# Patient Record
Sex: Male | Born: 1985 | Race: Black or African American | Hispanic: No | Marital: Single | Smoking: Never smoker
Health system: Southern US, Community
[De-identification: ages and names within clinical notes are randomized; demographics above are authoritative.]

## PROBLEM LIST (undated history)

## (undated) HISTORY — PX: HAND SURGERY: SHX662

---
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
## 2015-04-15 ENCOUNTER — Ambulatory Visit
Admission: EM | Admit: 2015-04-15 | Discharge: 2015-04-15 | Disposition: A | Discharge status: Other Acute Inpatient Hospital | Payer: Self-pay

## 2015-04-16 ENCOUNTER — Ambulatory Visit
Admission: EM | Admit: 2015-04-16 | Discharge: 2015-04-16 | Disposition: A | Discharge status: Home / Self Care | Payer: Self-pay | Attending: Family Medicine | Admitting: Family Medicine

## 2015-04-16 ENCOUNTER — Ambulatory Visit: Payer: Self-pay

## 2015-04-16 ENCOUNTER — Encounter: Payer: Self-pay | Admitting: Emergency Medicine

## 2015-04-16 DIAGNOSIS — M5489 Other dorsalgia: Secondary | ICD-10-CM

## 2015-04-16 DIAGNOSIS — M25551 Pain in right hip: Secondary | ICD-10-CM

## 2015-04-16 DIAGNOSIS — M2580 Other specified joint disorders, unspecified joint: Secondary | ICD-10-CM | POA: Insufficient documentation

## 2015-04-16 DIAGNOSIS — M7611 Psoas tendinitis, right hip: Secondary | ICD-10-CM

## 2015-04-16 MED ORDER — NAPROXEN 500 MG PO TABS: 500.0000 mg | ORAL_TABLET | Freq: Two times a day (BID) | ORAL | Status: AC

## 2015-04-16 NOTE — ED Notes (Signed)
Patient states he is "having right leg and hip pain x 2 weeks "

## 2015-04-16 NOTE — ED Provider Notes (Signed)
Patient presents today with right hip and back pain. Patient states that he has had the pain for about 2 weeks. He denies any trauma or fall to the area. 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 He denies any insect bites of the area, any swelling of the leg, any discoloration of the leg, any tingling or numbness of the leg, any weakness of the leg. Patient states that when he is still or lying down he does not have any pain. The pain is usually with movement. Patient does have some right buttock pain extending to the posterior right thigh with sitting. He denies any history of chronic hip or back problems. He did play high school and college football. He denies any personal or family history of thrombus. He has only taken ibuprofen once since his symptoms began.  ROS: Negative except mentioned above. Vitals as per Epic.  GENERAL: NAD CARDIOVASCULAR: RRR RESP: CTA B BACK: Lumbosacral spine area reveals no midline lower lumbar tenderness. No mass appreciated. Mild right SI Joint pain with flexion, Straight leg raise is negative. Motor strength and sensation normal, Peripheral pulses are palpable, -Homans. There is no focal tenderness to palpation in the groin/testicle area, no mass appreciated, mildly decreased external and internal rotation of right hip compared to left, tight hamstrings and hip flexors, mild tenderness along right piriformis/gluteal area, no defect of hamstring appreciated.  ASSESSMENT: FAI   PLAN: Naprosyn prn. Proper lifting with avoidance of heavy lifting discussed. Work restrictions given. Consider Physical Therapy or further imaging if not improving. Should follow-up with hip orthopedics. Disc of Xrays given. Seek medical attention if symptoms persist or worsen as discussed.        Jolene Provost, MD 04/16/15 [phone]

## 2016-02-18 ENCOUNTER — Ambulatory Visit: Admission: EM | Admit: 2016-02-18 | Discharge: 2016-02-18 | Discharge status: Absent without leave | Payer: Self-pay

## 2016-02-18 ENCOUNTER — Ambulatory Visit
Admission: EM | Admit: 2016-02-18 | Discharge: 2016-02-18 | Disposition: A | Discharge status: Home / Self Care | Payer: Self-pay | Attending: Family Medicine | Admitting: Family Medicine

## 2016-02-18 DIAGNOSIS — M76891 Other specified enthesopathies of right lower limb, excluding foot: Secondary | ICD-10-CM

## 2016-02-18 DIAGNOSIS — M65851 Other synovitis and tenosynovitis, right thigh: Secondary | ICD-10-CM

## 2016-02-18 DIAGNOSIS — M25551 Pain in right hip: Secondary | ICD-10-CM

## 2016-02-18 MED ORDER — HYDROCODONE-ACETAMINOPHEN 5-325 MG PO TABS: 1.0000 | ORAL_TABLET | Freq: Four times a day (QID) | ORAL | Status: AC | PRN

## 2016-02-18 MED ORDER — IBUPROFEN 800 MG PO TABS: 800.0000 mg | ORAL_TABLET | Freq: Three times a day (TID) | ORAL | Status: AC | PRN

## 2016-02-18 NOTE — ED Notes (Signed)
Patient complains of right hip pain. Patient states that he was here in 03/2015 for the same issue. Patient states that he does a lot of bending at work and seems to cause hip pain to flare up. Patient states that he thinks this could be his hip flexors.

## 2016-02-18 NOTE — Discharge Instructions (Signed)
Tendinitis °Tendinitis is swelling and inflammation of the tendons. Tendons are band-like tissues that connect muscle to bone. Tendinitis commonly occurs in the:  °· Shoulders (rotator cuff). °· Heels (Achilles tendon). °· Elbows (triceps tendon). °CAUSES °Tendinitis is usually caused by overusing the tendon, muscles, and joints involved. When the tissue surrounding a tendon (synovium) becomes inflamed, it is called tenosynovitis. Tendinitis commonly develops in people whose jobs require repetitive motions. °SYMPTOMS °· Pain. °· Tenderness. °· Mild swelling. °DIAGNOSIS °Tendinitis is usually diagnosed by physical exam. Your health care provider may also order X-rays or other imaging tests. °TREATMENT °Your health care provider may recommend certain medicines or exercises for your treatment. °HOME CARE INSTRUCTIONS  °· Use a sling or splint for as long as directed by your health care provider until the pain decreases. °· Put ice on the injured area. °¨ Put ice in a plastic bag. °¨ Place a towel between your skin and the bag. °¨ Leave the ice on for 15-20 minutes, 3-4 times a day, or as directed by your health care provider. °· Avoid using the limb while the tendon is painful. Perform gentle range of motion exercises only as directed by your health care provider. Stop exercises if pain or discomfort increase, unless directed otherwise by your health care provider. °· Only take over-the-counter or prescription medicines for pain, discomfort, or fever as directed by your health care provider. °SEEK MEDICAL CARE IF:  °· Your pain and swelling increase. °· You develop new, unexplained symptoms, especially increased numbness in the hands. °MAKE SURE YOU:  °· Understand these instructions. °· Will watch your condition. °· Will get help right away if you are not doing well or get worse. °  °This information is not intended to replace advice given to you by your health care provider. Make sure you discuss any questions you  have with your health care provider. °  °Document Released: 09/03/2000 Document Revised: 09/27/2014 Document Reviewed: 11/23/2010 °[employer]vier Interactive Patient Education ©2016 [employer] ° °

## 2016-04-07 IMAGING — CR DG HIP (WITH OR WITHOUT PELVIS) 2-3V*R*
3 series · 3 of 3 positions shown · non-contrast
Comparison: None.

CLINICAL DATA: Right hip pain and buttock pain for 2 weeks

EXAM:
DG HIP (WITH OR WITHOUT PELVIS) 2-3V RIGHT

[pelvis ap]
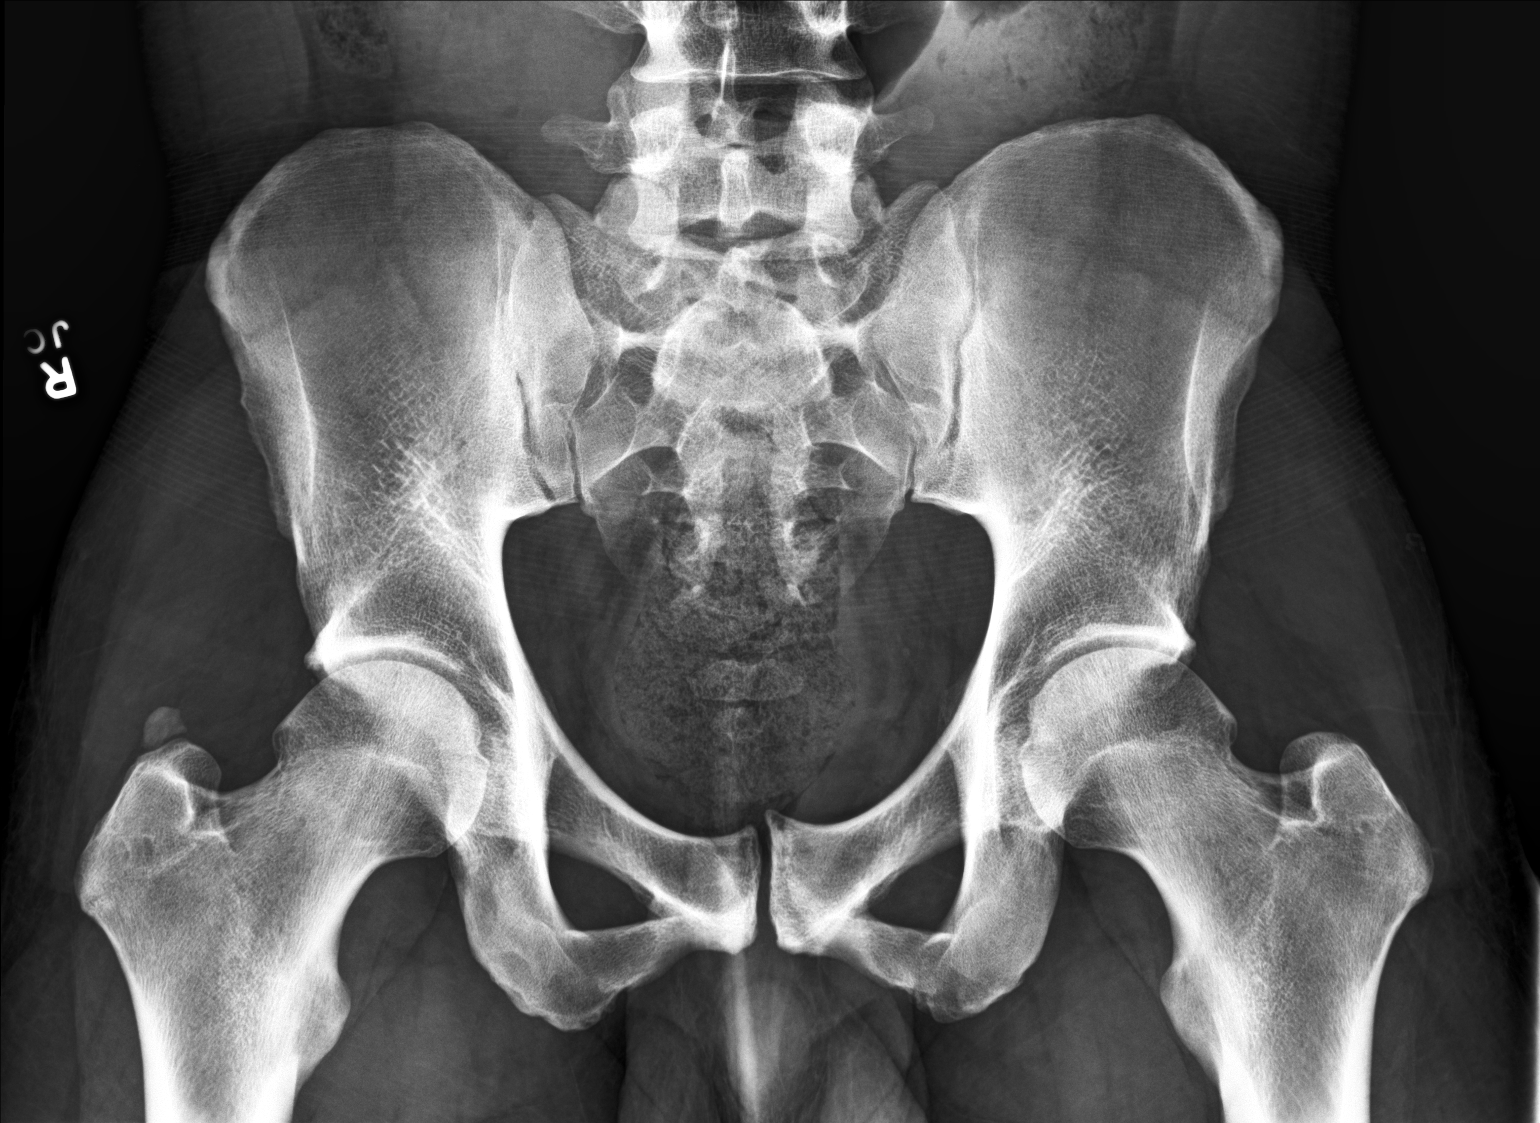

[hip ap]
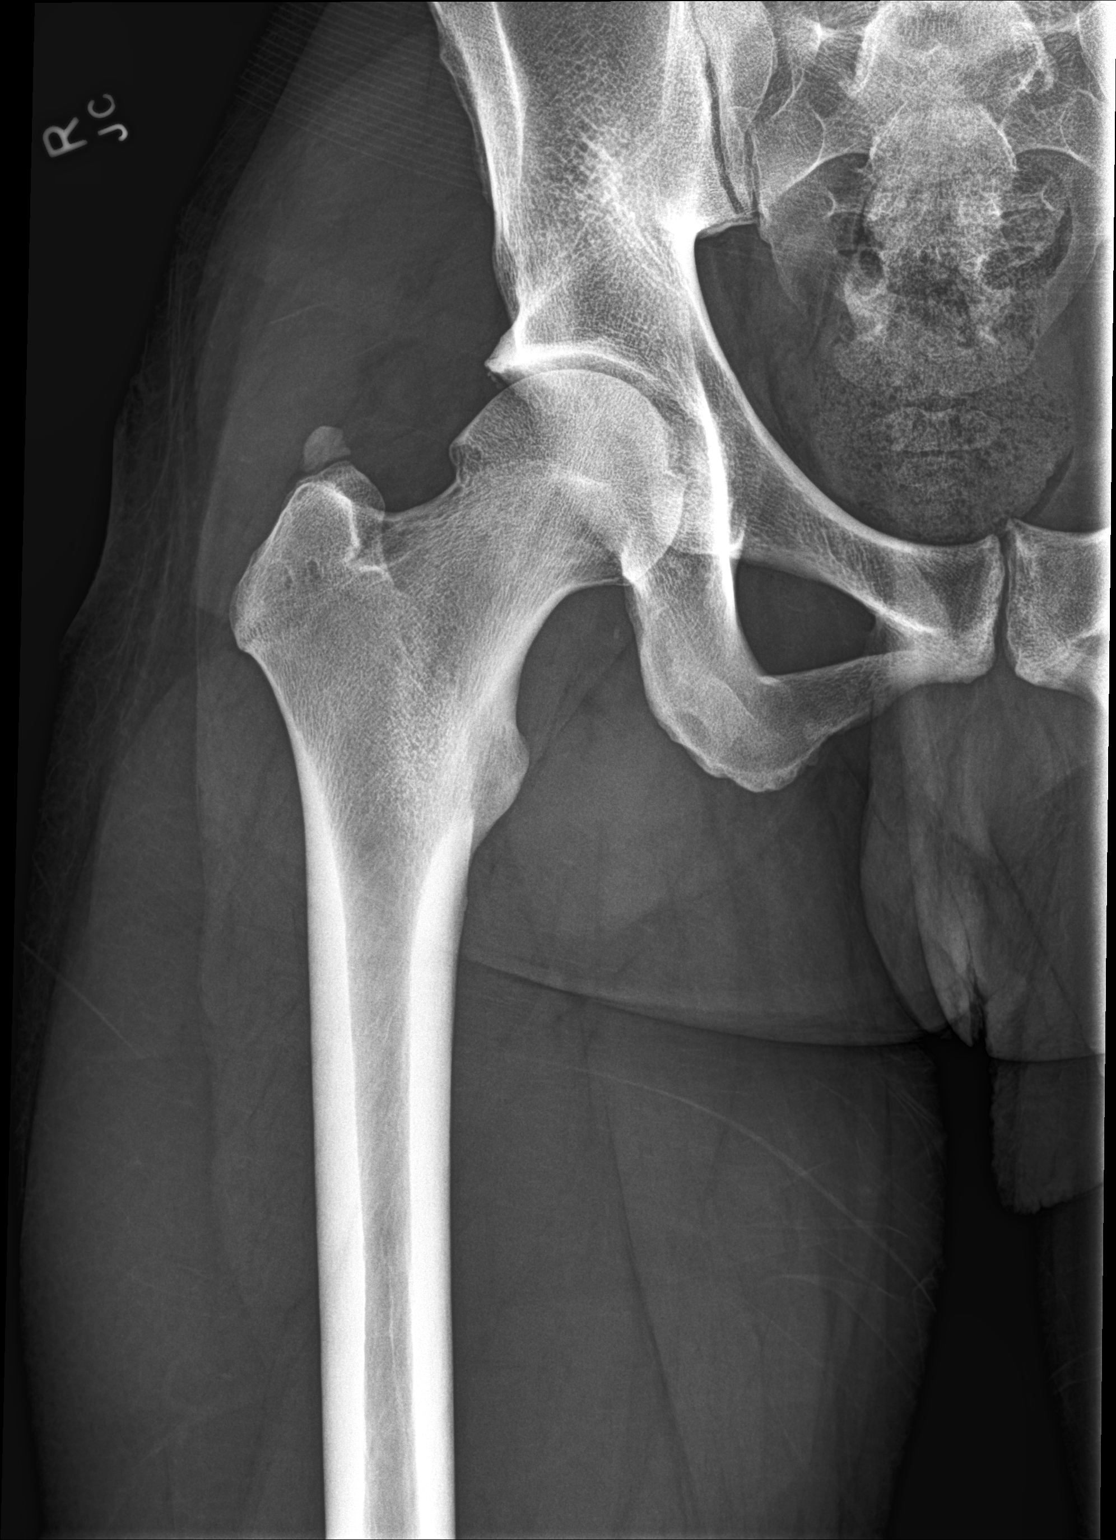

[hip lat]
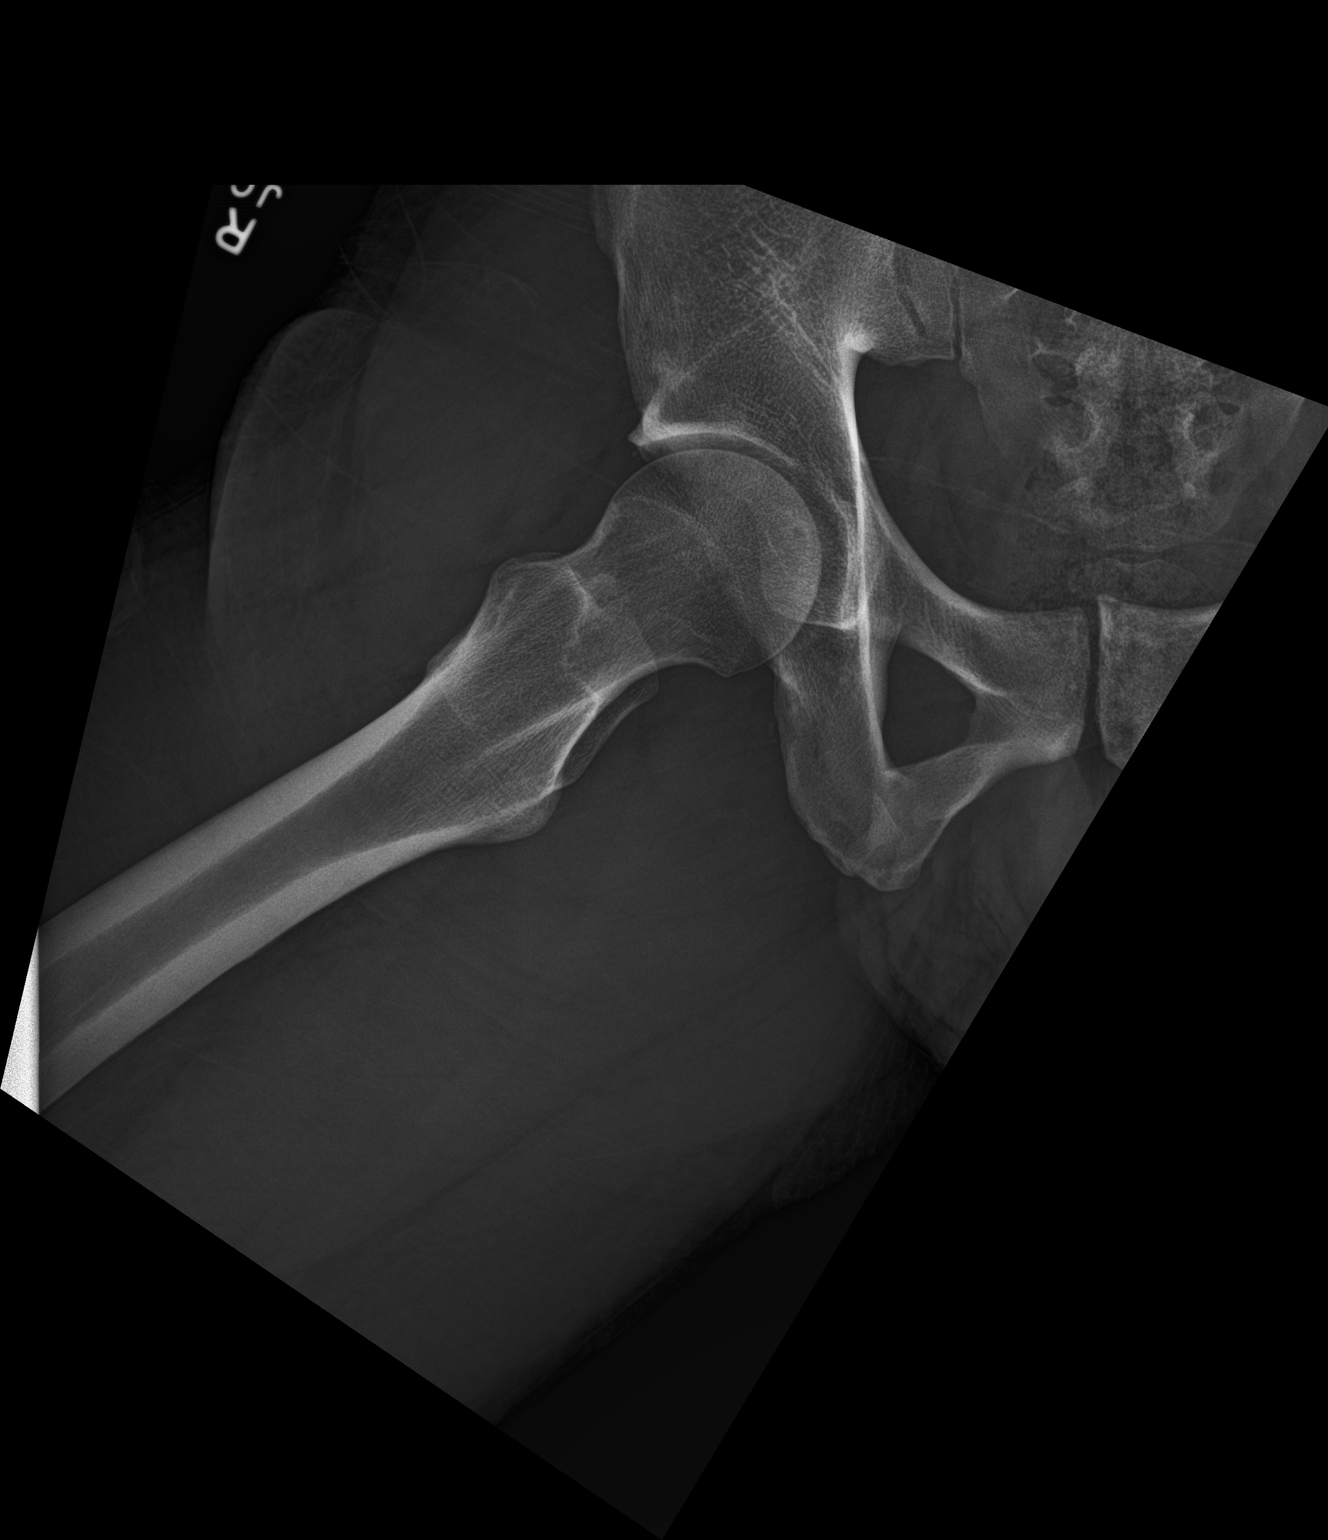

[3 of 3 positions shown; findings below may reference images not displayed]

FINDINGS: There is no evidence of hip fracture or dislocation. There is
osseous prominence of the femoral head-neck junction as can be seen
with femoroacetabular impingement. There is calcification adjacent
to the right greater trochanter.
IMPRESSION: 1. No acute osseous injury of the right hip.
2. Calcification adjacent to the right greater trochanter superiorly
which may reflect calcific tendinosis of the obturator internus or
piriformis.
3. Osseous prominence of the femoral head-neck junction as can be
seen with femoroacetabular impingement. If there is clinical concern
recurring labral injury, recommend further evaluation with MR
arthrogram of the hip.

## 2016-04-10 NOTE — ED Provider Notes (Signed)
CSN: [id number]     Arrival date & time 02/18/16  1159 History   First MD Initiated Contact with Patient 02/18/16 1256     Chief Complaint  Patient presents with  . Hip Pain   (Consider location/radiation/quality/duration/timing/severity/associated sxs/prior Treatment) Patient is a 30 y.o. male presenting with hip pain.  Hip Pain This is a new problem. The current episode started more than 1 week ago. The problem occurs constantly. The problem has been gradually worsening. Pertinent negatives include no chest pain, no abdominal pain, no headaches and no shortness of breath. Nothing aggravates the symptoms.    History reviewed. No pertinent past medical history. Past Surgical History  Procedure Laterality Date  . Hand surgery Right    Family History  Problem Relation Age of Onset  . Diabetes Mother    Social History  Substance Use Topics  . Smoking status: Never Smoker   . Smokeless tobacco: Never Used  . Alcohol Use: No    Review of Systems  Respiratory: Negative for shortness of breath.   Cardiovascular: Negative for chest pain.  Gastrointestinal: Negative for abdominal pain.  Neurological: Negative for headaches.    Allergies  Review of patient's allergies indicates no known allergies.  Home Medications   Prior to Admission medications   Medication Sig Start Date End Date Taking? Authorizing Provider  HYDROcodone-acetaminophen (NORCO/VICODIN) 5-325 MG tablet Take 1-2 tablets by mouth every 6 (six) hours as needed. 02/18/16   Payton Mccallum, MD  ibuprofen (ADVIL,MOTRIN) 800 MG tablet Take 1 tablet (800 mg total) by mouth every 8 (eight) hours as needed. 02/18/16   Payton Mccallum, MD   Meds Ordered and Administered this Visit  Medications - No data to display  BP 110/57 mmHg  Pulse 71  Temp(Src) 97.4 F (36.3 C) (Tympanic)  Resp 17  Ht 6' (1.829 m)  Wt 215 lb (97.523 kg)  BMI 29.15 kg/m2  SpO2 98% No data found.   Physical Exam  Constitutional: He appears  well-developed and well-nourished. No distress.  Musculoskeletal:       Right hip: He exhibits tenderness. He exhibits normal range of motion, normal strength, no bony tenderness, no swelling, no crepitus, no deformity and no laceration.  Skin: He is not diaphoretic.  Nursing note and vitals reviewed.   ED Course  Procedures (including critical care time)  Labs Review Labs Reviewed - No data to display  Imaging Review No results found.   Visual Acuity Review  Right Eye Distance:   Left Eye Distance:   Bilateral Distance:    Right Eye Near:   Left Eye Near:    Bilateral Near:         MDM   1. Hip pain, right   2. Tendinitis of hip, right    Discharge Medication List as of 02/18/2016  1:08 PM    START taking these medications   Details  HYDROcodone-acetaminophen (NORCO/VICODIN) 5-325 MG tablet Take 1-2 tablets by mouth every 6 (six) hours as needed., Starting 02/18/2016, Until Discontinued, Print    ibuprofen (ADVIL,MOTRIN) 800 MG tablet Take 1 tablet (800 mg total) by mouth every 8 (eight) hours as needed., Starting 02/18/2016, Until Discontinued, Normal       1. Labs/x-ray results and diagnosis reviewed with patient/parent/guardian/family 2. rx as per orders above; reviewed possible side effects, interactions, risks and benefits  3. Recommend supportive treatment with  4. Follow-up prn if symptoms worsen or don't improve    Payton Mccallum, MD 04/10/16 [phone]
